# Patient Record
Sex: Female | Born: 1935 | Race: White | Hispanic: No | Smoking: Never smoker
Health system: Southern US, Community
[De-identification: ages and names within clinical notes are randomized; demographics above are authoritative.]

## PROBLEM LIST (undated history)

## (undated) DIAGNOSIS — M810 Age-related osteoporosis without current pathological fracture: Secondary | ICD-10-CM

## (undated) DIAGNOSIS — E079 Disorder of thyroid, unspecified: Secondary | ICD-10-CM

## (undated) DIAGNOSIS — H3530 Unspecified macular degeneration: Secondary | ICD-10-CM

## (undated) DIAGNOSIS — I10 Essential (primary) hypertension: Secondary | ICD-10-CM

## (undated) HISTORY — PX: BACK SURGERY: SHX140

## (undated) HISTORY — PX: FINGER SURGERY: SHX640

## (undated) HISTORY — PX: CARPAL TUNNEL RELEASE: SHX101

---

## 2020-02-23 ENCOUNTER — Other Ambulatory Visit: Payer: Self-pay

## 2020-02-23 ENCOUNTER — Ambulatory Visit (INDEPENDENT_AMBULATORY_CARE_PROVIDER_SITE_OTHER): Payer: Medicare Other

## 2020-02-23 ENCOUNTER — Encounter (HOSPITAL_COMMUNITY): Payer: Self-pay

## 2020-02-23 ENCOUNTER — Ambulatory Visit (HOSPITAL_COMMUNITY)
Admission: EM | Admit: 2020-02-23 | Discharge: 2020-02-23 | Disposition: A | Discharge status: Home / Self Care | Payer: Medicare Other | Attending: Urgent Care | Admitting: Urgent Care

## 2020-02-23 DIAGNOSIS — E079 Disorder of thyroid, unspecified: Secondary | ICD-10-CM | POA: Diagnosis not present

## 2020-02-23 DIAGNOSIS — R05 Cough: Secondary | ICD-10-CM

## 2020-02-23 DIAGNOSIS — R070 Pain in throat: Secondary | ICD-10-CM | POA: Insufficient documentation

## 2020-02-23 DIAGNOSIS — I10 Essential (primary) hypertension: Secondary | ICD-10-CM | POA: Insufficient documentation

## 2020-02-23 DIAGNOSIS — R0981 Nasal congestion: Secondary | ICD-10-CM | POA: Insufficient documentation

## 2020-02-23 DIAGNOSIS — Z20822 Contact with and (suspected) exposure to covid-19: Secondary | ICD-10-CM | POA: Insufficient documentation

## 2020-02-23 DIAGNOSIS — Z79899 Other long term (current) drug therapy: Secondary | ICD-10-CM | POA: Diagnosis not present

## 2020-02-23 DIAGNOSIS — J069 Acute upper respiratory infection, unspecified: Secondary | ICD-10-CM | POA: Diagnosis not present

## 2020-02-23 HISTORY — DX: Age-related osteoporosis without current pathological fracture: M81.0

## 2020-02-23 HISTORY — DX: Essential (primary) hypertension: I10

## 2020-02-23 HISTORY — DX: Unspecified macular degeneration: H35.30

## 2020-02-23 HISTORY — DX: Disorder of thyroid, unspecified: E07.9

## 2020-02-23 LAB — SARS CORONAVIRUS 2 (TAT 6-24 HRS): SARS Coronavirus 2: NEGATIVE

## 2020-02-23 MED ORDER — BENZONATATE 100 MG PO CAPS: 100.0000 mg | ORAL_CAPSULE | Freq: Three times a day (TID) | ORAL | 0 refills | Status: DC | PRN

## 2020-02-23 MED ORDER — PROMETHAZINE-DM 6.25-15 MG/5ML PO SYRP: 5.0000 mL | ORAL_SOLUTION | Freq: Every evening | ORAL | 0 refills | Status: AC | PRN

## 2020-02-23 MED ORDER — CETIRIZINE HCL 5 MG PO TABS: 5.0000 mg | ORAL_TABLET | Freq: Every day | ORAL | 0 refills | Status: AC

## 2020-02-23 NOTE — ED Provider Notes (Signed)
[facility]   MRN: [id number] DOB: 08/06/1934  Subjective:   Shirley Ellison is a 85 y.o. female presenting for 3-day history of sinus congestion, throat pain, dry cough.  Patient has had COVID vaccination, last dose was in June.  Denies fever, loss of sense of taste and smell, chest pain, shortness of breath, wheezing, nausea, vomiting, belly pain, body aches.  Patient is not a smoker.  Denies history of lung disorders.  Patient requested Bactrim antibiotic course.  No current facility-administered medications for this encounter.  Current Outpatient Medications:  .  amLODipine (NORVASC) 5 MG tablet, Take 5 mg by mouth daily., Disp: , Rfl:  .  denosumab (PROLIA) 60 MG/ML SOSY injection, Inject 60 mg into the skin every 6 (six) months., Disp: , Rfl:  .  gabapentin (NEURONTIN) 300 MG capsule, Take 300 mg by mouth 3 (three) times daily., Disp: , Rfl:  .  levothyroxine (SYNTHROID) 100 MCG tablet, Take 100 mcg by mouth daily before breakfast., Disp: , Rfl:  .  Multiple Vitamins-Minerals (PRESERVISION AREDS PO), Take by mouth., Disp: , Rfl:  .  pravastatin (PRAVACHOL) 40 MG tablet, Take 40 mg by mouth daily., Disp: , Rfl:  .  vitamin E (VITAMIN E) 1000 UNIT capsule, Take 5,000 Units by mouth daily., Disp: , Rfl:    No Known Allergies  Past Medical History:  Diagnosis Date  . Hypertension   . Macular degeneration   . Osteoporosis   . Thyroid disease      Past Surgical History:  Procedure Laterality Date  . BACK SURGERY    . CARPAL TUNNEL RELEASE Bilateral   . FINGER SURGERY Left     No family history on file.  Social History   Tobacco Use  . Smoking status: Never Smoker  . Smokeless tobacco: Never Used  Substance Use Topics  . Alcohol use: Not Currently  . Drug use: Never    ROS   Objective:   Vitals: BP (!) 139/101   Pulse 79   Temp 98.1 F (36.7 C) (Oral)   Resp 16   Ht 5\' 4"  (1.626 m)   Wt 140 lb (63.5 kg)   SpO2 99%   BMI 24.03 kg/m   Physical  Exam Constitutional:      General: She is not in acute distress.    Appearance: Normal appearance. She is well-developed. She is not ill-appearing, toxic-appearing or diaphoretic.  HENT:     Head: Normocephalic and atraumatic.     Right Ear: Tympanic membrane, ear canal and external ear normal. No drainage or tenderness. No middle ear effusion. There is no impacted cerumen. Tympanic membrane is not erythematous.     Left Ear: Tympanic membrane, ear canal and external ear normal. No drainage or tenderness.  No middle ear effusion. There is no impacted cerumen. Tympanic membrane is not erythematous.     Nose: Nose normal. No congestion or rhinorrhea.     Mouth/Throat:     Mouth: Mucous membranes are moist. No oral lesions.     Pharynx: Oropharynx is clear. No pharyngeal swelling, oropharyngeal exudate, posterior oropharyngeal erythema or uvula swelling.     Tonsils: No tonsillar exudate or tonsillar abscesses.  Eyes:     General: No scleral icterus.       Right eye: No discharge.        Left eye: No discharge.     Extraocular Movements: Extraocular movements intact.     Right eye: Normal extraocular motion.     Left eye: Normal  extraocular motion.     Conjunctiva/sclera: Conjunctivae normal.     Pupils: Pupils are equal, round, and reactive to light.  Cardiovascular:     Rate and Rhythm: Normal rate and regular rhythm.     Pulses: Normal pulses.     Heart sounds: Normal heart sounds. No murmur heard.  No friction rub. No gallop.   Pulmonary:     Effort: Pulmonary effort is normal. No respiratory distress.     Breath sounds: Normal breath sounds. No stridor. No wheezing, rhonchi or rales.     Comments: Mildly coarse lung sounds over mid to lower lung fields bilaterally. Musculoskeletal:     Cervical back: Normal range of motion and neck supple.  Lymphadenopathy:     Cervical: No cervical adenopathy.  Skin:    General: Skin is warm and dry.     Findings: No rash.  Neurological:      General: No focal deficit present.     Mental Status: She is alert and oriented to person, place, and time.  Psychiatric:        Mood and Affect: Mood normal.        Behavior: Behavior normal.        Thought Content: Thought content normal.        Judgment: Judgment normal.     DG Chest 2 View  Result Date: 02/23/2020 CLINICAL DATA:  Cough EXAM: CHEST - 2 VIEW COMPARISON:  None. FINDINGS: Lungs are clear. The heart size and pulmonary vascularity are normal. No adenopathy. There is evidence of previous kyphoplasty procedure for fracture in the lower thoracic region. IMPRESSION: Lungs clear.  Cardiac silhouette within normal limits. Electronically Signed   By: Bretta Bang III M.D.   On: 02/23/2020 10:26    Assessment and Plan :   PDMP not reviewed this encounter.  1. Viral URI with cough   2. Sinus congestion   3. Throat pain     Low suspicion for COVID-19, lab test pending.  Will manage for viral upper respiratory infection with supportive care.  Chest x-ray very reassuring.  Vital signs stable for outpatient management.  Counseled against antibiotic use given timeframe of symptoms, antibiotic stewardship.  Counseled patient on potential for adverse effects with medications prescribed/recommended today, ER and return-to-clinic precautions discussed, patient verbalized understanding.    Wallis Bamberg, PA-C 02/23/20 1037

## 2020-02-23 NOTE — ED Triage Notes (Signed)
Pt c/o non productive cough, sore throat, nasal congestionx3 days. Pt denies dysphagia. Pt has non labored breathing. Pt denies SOB.

## 2020-06-18 ENCOUNTER — Other Ambulatory Visit: Payer: Self-pay

## 2020-06-18 ENCOUNTER — Emergency Department (HOSPITAL_COMMUNITY)
Admission: EM | Admit: 2020-06-18 | Discharge: 2020-06-18 | Disposition: A | Discharge status: Home / Self Care | Payer: Medicare Other | Attending: Emergency Medicine | Admitting: Emergency Medicine

## 2020-06-18 DIAGNOSIS — I10 Essential (primary) hypertension: Secondary | ICD-10-CM | POA: Diagnosis not present

## 2020-06-18 DIAGNOSIS — Z79899 Other long term (current) drug therapy: Secondary | ICD-10-CM | POA: Insufficient documentation

## 2020-06-18 DIAGNOSIS — E039 Hypothyroidism, unspecified: Secondary | ICD-10-CM | POA: Insufficient documentation

## 2020-06-18 DIAGNOSIS — R42 Dizziness and giddiness: Secondary | ICD-10-CM

## 2020-06-18 LAB — CBC
HCT: 43.6 % (ref 36.0–46.0)
Hemoglobin: 13.3 g/dL (ref 12.0–15.0)
MCH: 27.2 pg (ref 26.0–34.0)
MCHC: 30.5 g/dL (ref 30.0–36.0)
MCV: 89.2 fL (ref 80.0–100.0)
Platelets: 242 10*3/uL (ref 150–400)
RBC: 4.89 MIL/uL (ref 3.87–5.11)
RDW: 13.6 % (ref 11.5–15.5)
WBC: 7.6 10*3/uL (ref 4.0–10.5)
nRBC: 0 % (ref 0.0–0.2)

## 2020-06-18 LAB — BASIC METABOLIC PANEL
Anion gap: 10 (ref 5–15)
BUN: 13 mg/dL (ref 8–23)
CO2: 24 mmol/L (ref 22–32)
Calcium: 9.3 mg/dL (ref 8.9–10.3)
Chloride: 105 mmol/L (ref 98–111)
Creatinine, Ser: 1.01 mg/dL — ABNORMAL HIGH (ref 0.44–1.00)
GFR, Estimated: 55 mL/min — ABNORMAL LOW (ref >60)
Glucose, Bld: 141 mg/dL — ABNORMAL HIGH (ref 70–99)
Potassium: 4.1 mmol/L (ref 3.5–5.1)
Sodium: 139 mmol/L (ref 135–145)

## 2020-06-18 LAB — CBG MONITORING, ED: Glucose-Capillary: 133 mg/dL — ABNORMAL HIGH (ref 70–99)

## 2020-06-18 MED ORDER — MECLIZINE HCL 25 MG PO TABS: 12.5000 mg | ORAL_TABLET | Freq: Three times a day (TID) | ORAL | 0 refills | Status: AC | PRN

## 2020-06-18 NOTE — ED Provider Notes (Signed)
[facility] EMERGENCY DEPARTMENT Provider Note   CSN: [id number] Arrival date & time: 06/18/20  1240     History Chief Complaint  Patient presents with  . Dizziness    Shirley Ellison is a 85 y.o. female presenting for evaluation of dizziness.  Patient states today she was about to eat lunch when she suddenly felt dizzy.  She felt like the room was spinning.  This was worse when she moves her head from side to side.  This lasted for several minutes, she was unable to stand up.  She called on the phone to call her daughter-in-law.  By the time help arrived, patient symptoms have completely resolved.  She has been symptom-free since.  She denies fever, headache, current vision changes, slurred speech, chest pain, shortness breath, nausea, vomiting, abdominal pain, urinary symptoms, normal bowel movements.  She denies history of vertigo.  She denies fall, trauma, or injury to the head.  She denies recent URI or nasal congestion.  She denies recent medication changes.  She does have a history of hypothyroidism, levels were last checked last year at this time with her PCP.  Additional history came from chart review.  Patient with a history of hypertension, osteoporosis, hypothyroidism.  HPI     Past Medical History:  Diagnosis Date  . Hypertension   . Macular degeneration   . Osteoporosis   . Thyroid disease     There are no problems to display for this patient.   Past Surgical History:  Procedure Laterality Date  . BACK SURGERY    . CARPAL TUNNEL RELEASE Bilateral   . FINGER SURGERY Left      OB History   No obstetric history on file.     No family history on file.  Social History   Tobacco Use  . Smoking status: Never Smoker  . Smokeless tobacco: Never Used  Substance Use Topics  . Alcohol use: Not Currently  . Drug use: Never    Home Medications Prior to Admission medications   Medication Sig Start Date End Date Taking? Authorizing Provider    amLODipine (NORVASC) 5 MG tablet Take 5 mg by mouth daily.    [provider]  benzonatate (TESSALON) 100 MG capsule Take 1-2 capsules (100-200 mg total) by mouth 3 (three) times daily as needed. 02/23/20   Wallis Bamberg, PA-C  cetirizine (ZYRTEC) 5 MG tablet Take 1 tablet (5 mg total) by mouth daily. 02/23/20   Wallis Bamberg, PA-C  denosumab (PROLIA) 60 MG/ML SOSY injection Inject 60 mg into the skin every 6 (six) months.    [provider]  gabapentin (NEURONTIN) 300 MG capsule Take 300 mg by mouth 3 (three) times daily.    [provider]  levothyroxine (SYNTHROID) 100 MCG tablet Take 100 mcg by mouth daily before breakfast.    [provider]  meclizine (ANTIVERT) 25 MG tablet Take 0.5 tablets (12.5 mg total) by mouth 3 (three) times daily as needed for dizziness. 06/18/20   Presten Joost, PA-C  Multiple Vitamins-Minerals (PRESERVISION AREDS PO) Take by mouth.    [provider]  pravastatin (PRAVACHOL) 40 MG tablet Take 40 mg by mouth daily.    [provider]  promethazine-dextromethorphan (PROMETHAZINE-DM) 6.25-15 MG/5ML syrup Take 5 mLs by mouth at bedtime as needed for cough. 02/23/20   Wallis Bamberg, PA-C  vitamin E (VITAMIN E) 1000 UNIT capsule Take 5,000 Units by mouth daily.    [provider]    Allergies  Patient has no known allergies.  Review of Systems   Review of Systems  Neurological: Positive for dizziness (Resolved).  All other systems reviewed and are negative.   Physical Exam Updated Vital Signs BP (!) 150/85 (BP Location: Left Arm)   Pulse 70   Temp 98.3 F (36.8 C) (Oral)   Resp 19   SpO2 99%   Physical Exam Vitals and nursing note reviewed.  Constitutional:      General: She is not in acute distress.    Appearance: She is well-developed.     Comments: Sitting comfortably in the bed in no acute distress  HENT:     Head: Normocephalic and atraumatic.  Eyes:     Extraocular Movements:  Extraocular movements intact.     Conjunctiva/sclera: Conjunctivae normal.     Pupils: Pupils are equal, round, and reactive to light.  Cardiovascular:     Rate and Rhythm: Normal rate and regular rhythm.     Pulses: Normal pulses.  Pulmonary:     Effort: Pulmonary effort is normal. No respiratory distress.     Breath sounds: Normal breath sounds. No wheezing.  Abdominal:     General: There is no distension.     Palpations: Abdomen is soft. There is no mass.     Tenderness: There is no abdominal tenderness. There is no guarding or rebound.  Musculoskeletal:        General: Normal range of motion.     Cervical back: Normal range of motion and neck supple.  Skin:    General: Skin is warm and dry.     Capillary Refill: Capillary refill takes less than 2 seconds.  Neurological:     Mental Status: She is alert and oriented to person, place, and time.     GCS: GCS eye subscore is 4. GCS verbal subscore is 5. GCS motor subscore is 6.     Cranial Nerves: Cranial nerves are intact.     Sensory: Sensation is intact.     Motor: Motor function is intact. No pronator drift.     Coordination: Coordination is intact.     Comments: CN intact.  Nose to finger intact.  Fine movement and coordination intact.  Negative pronator drift.     ED Results / Procedures / Treatments   Labs (all labs ordered are listed, but only abnormal results are displayed) Labs Reviewed  BASIC METABOLIC PANEL - Abnormal; Notable for the following components:      Result Value   Glucose, Bld 141 (*)    Creatinine, Ser 1.01 (*)    GFR, Estimated 55 (*)    All other components within normal limits  CBG MONITORING, ED - Abnormal; Notable for the following components:   Glucose-Capillary 133 (*)    All other components within normal limits  CBC  URINALYSIS, ROUTINE W REFLEX MICROSCOPIC  CBG MONITORING, ED    EKG EKG Interpretation  Date/Time:  Tuesday June 18 2020 13:11:47 EST Ventricular Rate:  77 PR  Interval:    QRS Duration: 82 QT Interval:  374 QTC Calculation: 423 R Axis:   3 Text Interpretation: Accelerated Junctional rhythm Septal infarct , age undetermined Abnormal ECG No prior ECG for comparison. No STEMI Confirmed by Theda Belfast ([id number]) on 06/18/2020 6:46:41 PM   Radiology No results found.  Procedures Procedures (including critical care time)  Medications Ordered in ED Medications - No data to display  ED Course  I have reviewed the triage vital signs and the nursing notes.  Pertinent labs & imaging results that were available during my care of the patient were reviewed by me and considered in my medical decision making (see chart for details).    MDM Rules/Calculators/A&P                          Patient presenting for evaluation of episode of dizziness.  I saw patient approximately 7 hours after arrival to the ER.  On exam, patient appears nontoxic.  Symptoms have resolved.  Neuro exam overall reassuring.  Labs obtained from triage interpreted by me, no concerning abnormalities.  Mildly elevated creatinine 1.01, no baseline to compare.  Hemoglobin stable.  Electrolytes otherwise stable.  Blood pressure minimally elevated at 150/80, however per chart review diastolic is improved from 3 months ago.  EKG unchanged from previous.  Discussed findings with patient.  Discussed options for continued work-up including checking thyroid, urine, and head CT.  Patient declined, stating she is feeling better, and wants to be home by 9 PM.  I discussed that she may have recurrent symptoms if this is vertigo.  Discussed use of meclizine as needed for recurrent symptoms.  I encouraged patient to follow-up with her primary care doctor for recheck of symptoms and continued management of blood pressure.  At this time, patient appears safe for discharge.  Return precautions given.  Patient states she understands and agrees to plan.  Final Clinical Impression(s) / ED Diagnoses Final  diagnoses:  Dizziness    Rx / DC Orders ED Discharge Orders         Ordered    meclizine (ANTIVERT) 25 MG tablet  3 times daily PRN        06/18/20 1952           Alveria Apley, PA-C 06/18/20 1955    Tegeler, Canary Brim, MD 06/18/20 [phone]

## 2020-06-18 NOTE — Discharge Instructions (Addendum)
Make sure you are staying well-hydrated water. If you have recurrent dizziness, take meclizine as needed. It is extremely important that you follow-up with your primary care doctor for recheck of your symptoms. Return to the emergency room if you develop recurrent dizziness, chest pain, numbness/weakness on one side, or any new, worsening, or concerning symptoms.

## 2020-06-18 NOTE — ED Triage Notes (Signed)
Pt here for eval of acute onset of dizziness at lunch today, which prevented her from being able to stand up. Dizziness has resolved and patient is ambulatory, but pt reports still not feeling right in her head, feeling anxious.

## 2020-11-01 ENCOUNTER — Ambulatory Visit
Admission: EM | Admit: 2020-11-01 | Discharge: 2020-11-01 | Disposition: A | Discharge status: Home / Self Care | Payer: Medicare Other | Attending: Internal Medicine | Admitting: Internal Medicine

## 2020-11-01 ENCOUNTER — Encounter: Payer: Self-pay | Admitting: Emergency Medicine

## 2020-11-01 ENCOUNTER — Ambulatory Visit (INDEPENDENT_AMBULATORY_CARE_PROVIDER_SITE_OTHER): Payer: Medicare Other

## 2020-11-01 DIAGNOSIS — M79661 Pain in right lower leg: Secondary | ICD-10-CM | POA: Diagnosis not present

## 2020-11-01 DIAGNOSIS — M25561 Pain in right knee: Secondary | ICD-10-CM | POA: Diagnosis not present

## 2020-11-01 NOTE — ED Triage Notes (Signed)
Right leg pain from the knee down.  Hurts to pick leg up and bend.  Pain for a couple of days.  Pain worse this afternoon.

## 2020-11-01 NOTE — ED Provider Notes (Addendum)
[facility]    CSN: [id number] Arrival date & time: 11/01/20  1914      History   Chief Complaint No chief complaint on file.   HPI Shirley Ellison is a 86 y.o. female who presents with posterior R lower leg pain since this pm. She was pushing a cart at the grocery store today and felt a mild ache on this area. Then after sitting for a while, her son in law noticed her using her hand to lift up her R leg to go up a step. Pt does not recall an injury today, but the pain is getting worse as the day goes on. Worse to lift up and bend it, some pain with full extension. Has hx of osteoporosis and is very active, but does not go for walks.     Past Medical History:  Diagnosis Date  . Hypertension   . Macular degeneration   . Osteoporosis   . Thyroid disease     There are no problems to display for this patient.   Past Surgical History:  Procedure Laterality Date  . BACK SURGERY    . CARPAL TUNNEL RELEASE Bilateral   . FINGER SURGERY Left     OB History   No obstetric history on file.      Home Medications    Prior to Admission medications   Medication Sig Start Date End Date Taking? Authorizing Provider  amLODipine (NORVASC) 5 MG tablet Take 5 mg by mouth daily.    [provider]  cetirizine (ZYRTEC) 5 MG tablet Take 1 tablet (5 mg total) by mouth daily. 02/23/20   Wallis Bamberg, PA-C  denosumab (PROLIA) 60 MG/ML SOSY injection Inject 60 mg into the skin every 6 (six) months.    [provider]  gabapentin (NEURONTIN) 300 MG capsule Take 300 mg by mouth 3 (three) times daily.    [provider]  levothyroxine (SYNTHROID) 100 MCG tablet Take 100 mcg by mouth daily before breakfast.    [provider]  meclizine (ANTIVERT) 25 MG tablet Take 0.5 tablets (12.5 mg total) by mouth 3 (three) times daily as needed for dizziness. 06/18/20   Caccavale, Sophia, PA-C  Multiple Vitamins-Minerals (PRESERVISION AREDS PO) Take by mouth.     [provider]  pravastatin (PRAVACHOL) 40 MG tablet Take 40 mg by mouth daily.    [provider]  promethazine-dextromethorphan (PROMETHAZINE-DM) 6.25-15 MG/5ML syrup Take 5 mLs by mouth at bedtime as needed for cough. 02/23/20   Wallis Bamberg, PA-C  vitamin E (VITAMIN E) 1000 UNIT capsule Take 5,000 Units by mouth daily.    [provider]    Family History Family History  Family history unknown: Yes    Social History Social History   Tobacco Use  . Smoking status: Never Smoker  . Smokeless tobacco: Never Used  Substance Use Topics  . Alcohol use: Not Currently  . Drug use: Never     Allergies   Patient has no known allergies.   Review of Systems Review of Systems  Cardiovascular: Negative for leg swelling.  Musculoskeletal: Positive for gait problem and joint swelling.  Skin: Negative for rash and wound.  Neurological: Negative for numbness.     Physical Exam Triage Vital Signs ED Triage Vitals  Enc Vitals Group     BP 11/01/20 1920 (!) 167/78     Pulse Rate 11/01/20 1920 76     Resp 11/01/20 1920 18     Temp 11/01/20 1920 97.9 F (  36.6 C)     Temp Source 11/01/20 1920 Oral     SpO2 11/01/20 1920 98 %     Weight --      Height --      Head Circumference --      Peak Flow --      Pain Score 11/01/20 1922 8     Pain Loc --      Pain Edu? --      Excl. in GC? --    No data found.  Updated Vital Signs BP (!) 167/78 (BP Location: Right Arm)   Pulse 76   Temp 97.9 F (36.6 C) (Oral)   Resp 18   SpO2 98%   Visual Acuity Right Eye Distance:   Left Eye Distance:   Bilateral Distance:    Right Eye Near:   Left Eye Near:    Bilateral Near:     Physical Exam Vitals and nursing note reviewed.  Constitutional:      General: She is not in acute distress.    Appearance: She is not toxic-appearing.  HENT:     Head: Normocephalic.     Right Ear: External ear normal.     Left Ear: External ear normal.  Eyes:     General:  No scleral icterus.    Conjunctiva/sclera: Conjunctivae normal.  Pulmonary:     Effort: Pulmonary effort is normal.  Musculoskeletal:        General: No swelling or deformity.     Cervical back: Neck supple.     Right lower leg: No edema.     Left lower leg: No edema.     Comments: R  Lower leg is not swollen and does not looks asymmetric. I could not provoke her pain with palpation of her calf or posterior knee, but the pain was provoked on lateral posterior knee with knee flexion, and pivoting.  Hamstring is not tender or swollen. She was unable to walk in her heels, but could on her tip toes.  Her R knee joint is larger than the L.  Has negative calf cords or Hoffman's sing  Skin:    General: Skin is warm and dry.  Neurological:     Mental Status: She is alert and oriented to person, place, and time.     Comments: Slow gait with slight limp when she begins to walk  Psychiatric:        Mood and Affect: Mood normal.        Behavior: Behavior normal.        Thought Content: Thought content normal.        Judgment: Judgment normal.      UC Treatments / Results  Labs (all labs ordered are listed, but only abnormal results are displayed) Labs Reviewed - No data to display  EKG   Radiology DG Tibia/Fibula Right  Result Date: 11/01/2020 CLINICAL DATA:  Posterior leg pain x2 days, no known injury EXAM: RIGHT TIBIA AND FIBULA - 2 VIEW COMPARISON:  None. FINDINGS: There is no evidence of fracture or other focal bone lesions. Soft tissues are unremarkable. IMPRESSION: Negative. Electronically Signed   By: Maudry Mayhew MD   On: 11/01/2020 19:51    Procedures Procedures (including critical care time)  Medications Ordered in UC Medications - No data to display  Initial Impression / Assessment and Plan / UC Course  I have reviewed the triage vital signs and the nursing notes. Pertinent  imaging results that were available during my care  of the patient were reviewed by me and  considered in my medical decision making (see chart for details).  She was placed on a knee brace and needs to FU next week with ortho or PCP See instructions  Final Clinical Impressions(s) / UC Diagnoses   Final diagnoses:  Arthralgia of right lower leg     Discharge Instructions     You may take Advil 200 mg three times a day for one week with meals for pain and inflammation  Follow up with orthopedics or your family Dr next week     ED Prescriptions    None     PDMP not reviewed this encounter.   Garey Ham, PA-C 11/01/20 1958    Rodriguez-Southworth, Protivin, PA-C 11/01/20 2000

## 2020-11-01 NOTE — Discharge Instructions (Addendum)
You may take Advil 200 mg three times a day for one week with meals for pain and inflammation  Follow up with orthopedics or your family Dr next week

## 2021-04-29 ENCOUNTER — Ambulatory Visit
Admission: EM | Admit: 2021-04-29 | Discharge: 2021-04-29 | Disposition: A | Discharge status: Home / Self Care | Payer: Medicare Other | Attending: Family Medicine | Admitting: Family Medicine

## 2021-04-29 ENCOUNTER — Other Ambulatory Visit: Payer: Self-pay

## 2021-04-29 DIAGNOSIS — K1120 Sialoadenitis, unspecified: Secondary | ICD-10-CM | POA: Diagnosis not present

## 2021-04-29 MED ORDER — CEFDINIR 300 MG PO CAPS
300.0000 mg | ORAL_CAPSULE | Freq: Two times a day (BID) | ORAL | 0 refills | Status: AC
Start: 2021-04-29 — End: ?

## 2021-04-29 NOTE — ED Triage Notes (Signed)
Patient presents to Urgent Care with complaints of neck swelling to left side. She states she has had this happened in the past and is treated with antibiotics.   Denies fever.

## 2021-04-30 NOTE — ED Provider Notes (Signed)
  [facility]   [id number] 04/29/21 Arrival Time: 1032  ASSESSMENT & PLAN:  1. Sialadenitis    No signs of abscess formation. Discussed. Begin: Meds ordered this encounter  Medications   cefdinir (OMNICEF) 300 MG capsule    Sig: Take 1 capsule (300 mg total) by mouth 2 (two) times daily.    Dispense:  14 capsule    Refill:  0    Follow-up Information     [facility].   Specialty: Urgent Care Why: If worsening or failing to improve as anticipated. Contact information: [address] [place] [address] [phone]                 Reviewed expectations re: course of current medical issues. Questions answered. Outlined signs and symptoms indicating need for more acute intervention. Patient verbalized understanding. After Visit Summary given.   SUBJECTIVE:  Shirley Ellison is a 86 y.o. female who reports swelling under L jaw; abrupt onset; past 24-48 hours; worsening. No ST. Afebrile. No swallowing difficulties.    OBJECTIVE:  Vitals:   04/29/21 1041  BP: 136/80  Pulse: 70  Resp: 16  Temp: 97.6 F (36.4 C)  TempSrc: Temporal  SpO2: 98%     General appearance: alert; no distress HEENT: throat appears normal; uvula is midline Neck: supple with FROM; swelling just under left jaw; TTP; no overlying erythema Lungs: speaks full sentences without difficulty; unlabored Abd: soft; non-tender Skin: reveals no rash; warm and dry Psychological: alert and cooperative; normal mood and affect  No Known Allergies  Past Medical History:  Diagnosis Date   Hypertension    Macular degeneration    Osteoporosis    Thyroid disease    Social History   Socioeconomic History   Marital status: Widowed    Spouse name: Not on file   Number of children: Not on file   Years of education: Not on file   Highest education level: Not on file  Occupational History   Not on file  Tobacco Use   Smoking  status: Never   Smokeless tobacco: Never  Vaping Use   Vaping Use: Never used  Substance and Sexual Activity   Alcohol use: Not Currently   Drug use: Never   Sexual activity: Not on file  Other Topics Concern   Not on file  Social History Narrative   Not on file   Social Determinants of Health   Financial Resource Strain: Not on file  Food Insecurity: Not on file  Transportation Needs: Not on file  Physical Activity: Not on file  Stress: Not on file  Social Connections: Not on file  Intimate Partner Violence: Not on file   Family History  Family history unknown: Yes           Mardella Layman, MD 04/30/21 [phone]

## 2021-12-27 IMAGING — DX DG CHEST 2V
2 series · 2 of 2 positions shown · non-contrast
Comparison: None.

CLINICAL DATA: Cough

EXAM:
CHEST - 2 VIEW

[chest pa]
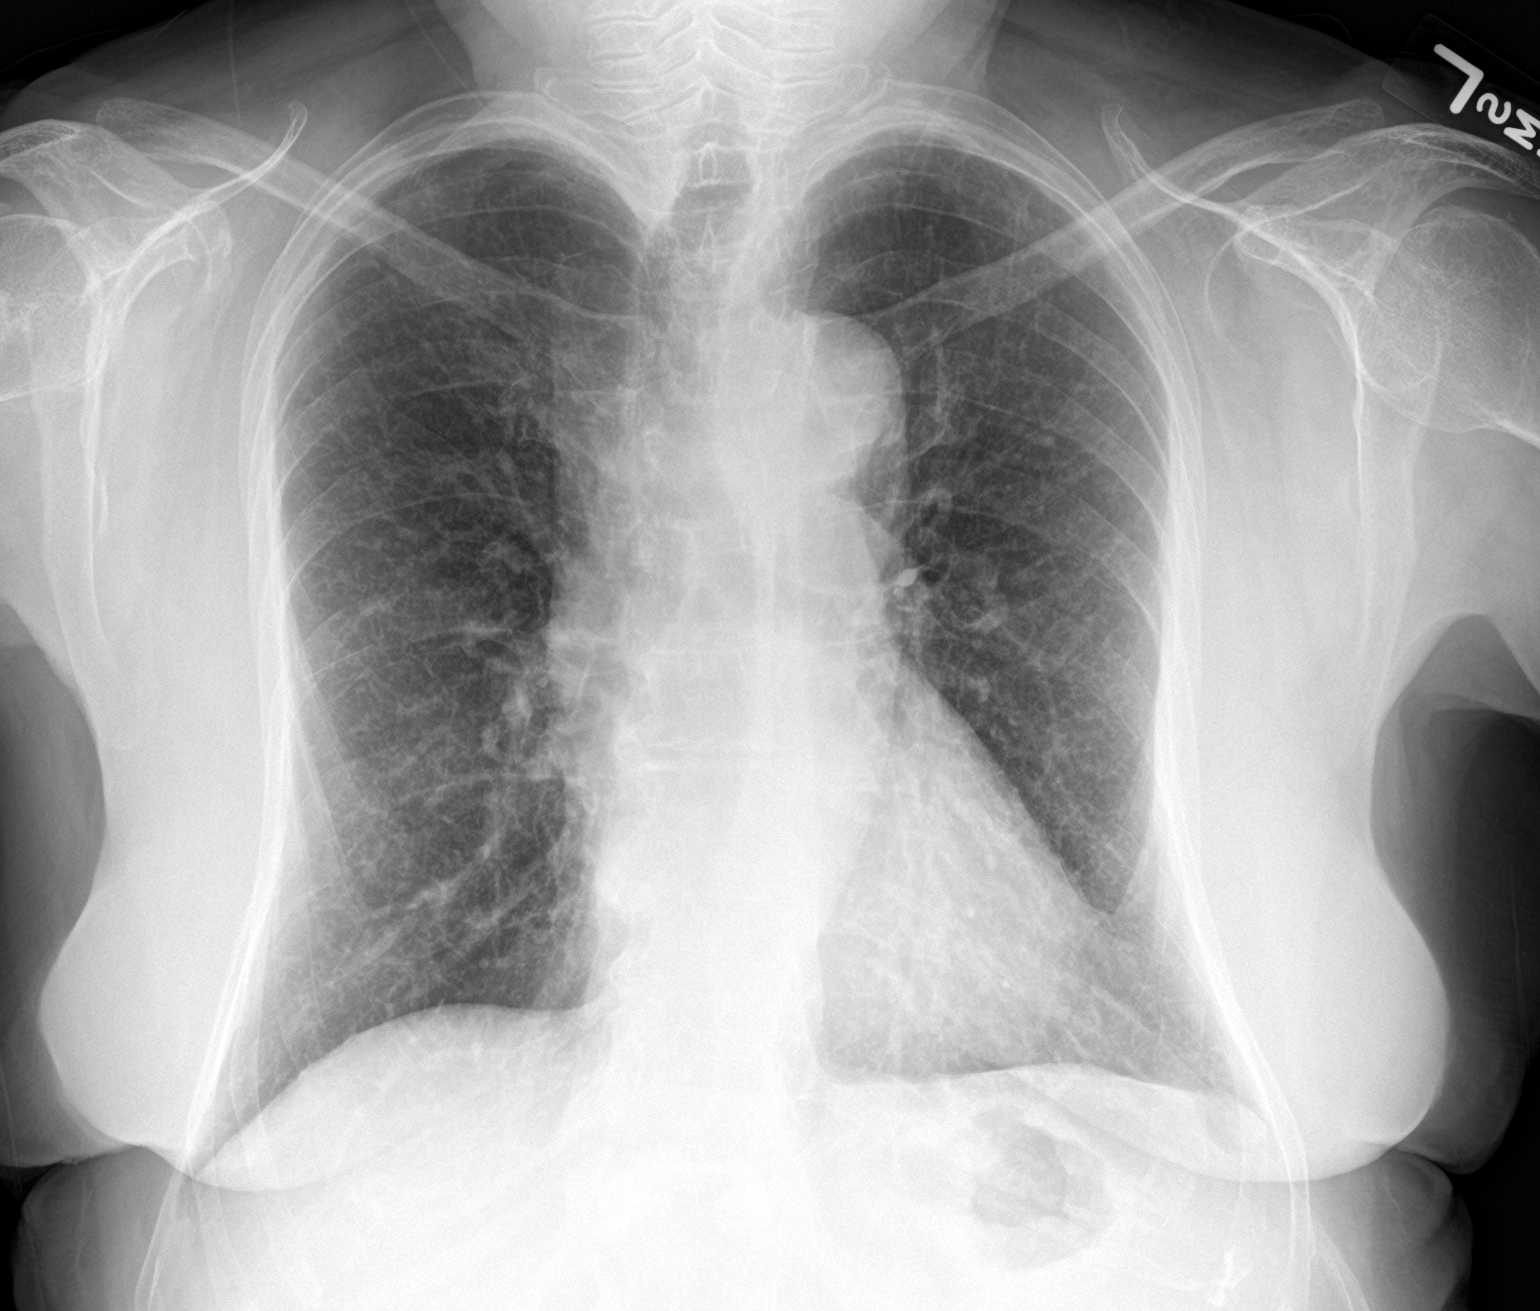

[chest lat]
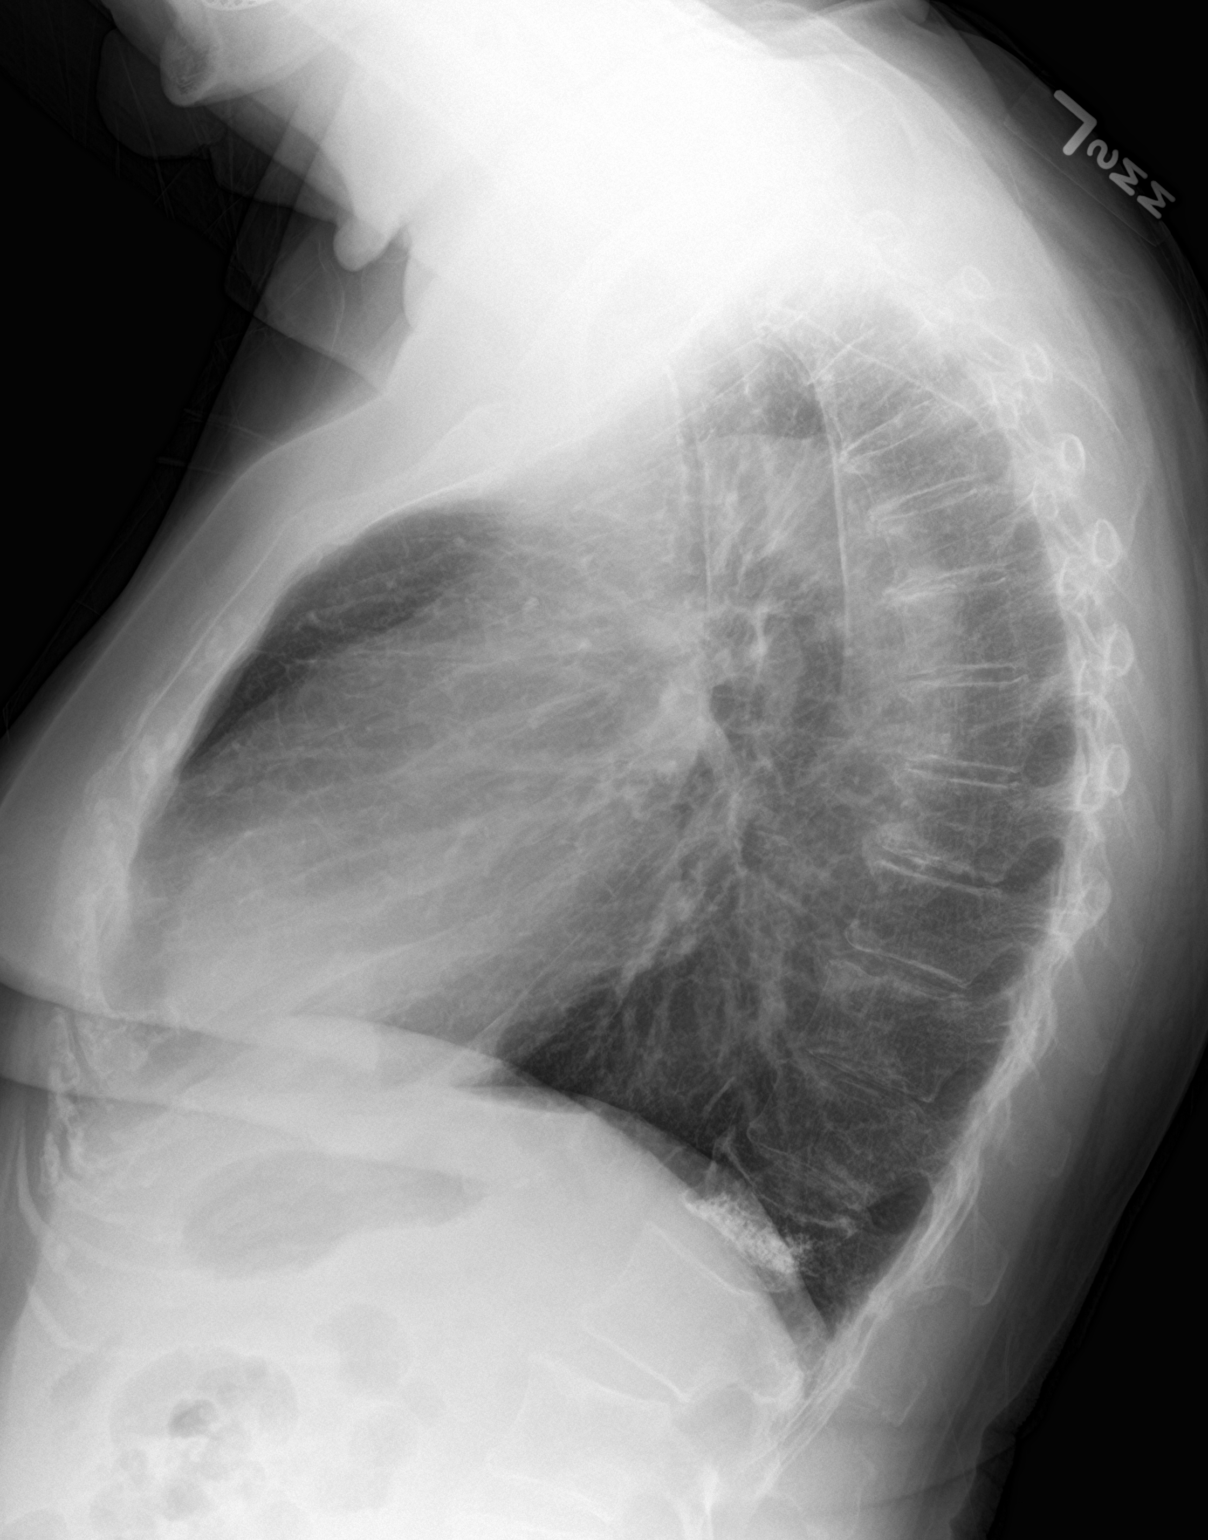

[2 of 2 positions shown; findings below may reference images not displayed]

FINDINGS: Lungs are clear. The heart size and pulmonary vascularity are
normal. No adenopathy. There is evidence of previous kyphoplasty
procedure for fracture in the lower thoracic region.
IMPRESSION: Lungs clear.  Cardiac silhouette within normal limits.
